# Patient Record
Sex: Male | Born: 1975 | Race: White | Hispanic: No | Marital: Married | Smoking: Light tobacco smoker
Health system: Southern US, Community
[De-identification: ages and names within clinical notes are randomized; demographics above are authoritative.]

---

## 2011-06-22 ENCOUNTER — Other Ambulatory Visit (HOSPITAL_COMMUNITY): Payer: Self-pay | Admitting: Urology

## 2011-06-22 DIAGNOSIS — N529 Male erectile dysfunction, unspecified: Secondary | ICD-10-CM

## 2011-06-30 ENCOUNTER — Other Ambulatory Visit (HOSPITAL_COMMUNITY): Payer: Self-pay | Admitting: Urology

## 2011-06-30 ENCOUNTER — Ambulatory Visit (HOSPITAL_COMMUNITY)
Admission: RE | Admit: 2011-06-30 | Discharge: 2011-06-30 | Disposition: A | Discharge status: Home / Self Care | Payer: BC Managed Care – PPO | Source: Ambulatory Visit | Attending: Urology | Admitting: Urology

## 2011-06-30 DIAGNOSIS — N508 Other specified disorders of male genital organs: Secondary | ICD-10-CM | POA: Insufficient documentation

## 2011-06-30 DIAGNOSIS — N529 Male erectile dysfunction, unspecified: Secondary | ICD-10-CM

## 2014-03-30 ENCOUNTER — Ambulatory Visit (INDEPENDENT_AMBULATORY_CARE_PROVIDER_SITE_OTHER): Payer: 59 | Admitting: Family Medicine

## 2014-03-30 ENCOUNTER — Encounter: Payer: Self-pay | Admitting: Family Medicine

## 2014-03-30 ENCOUNTER — Encounter (INDEPENDENT_AMBULATORY_CARE_PROVIDER_SITE_OTHER): Payer: Self-pay

## 2014-03-30 VITALS — BP 109/73 | HR 89 | Temp 97.2°F | Ht 70.0 in | Wt 256.6 lb

## 2014-03-30 DIAGNOSIS — R197 Diarrhea, unspecified: Secondary | ICD-10-CM

## 2014-03-30 MED ORDER — DIPHENOXYLATE-ATROPINE 2.5-0.025 MG PO TABS: 2.0000 | ORAL_TABLET | Freq: Four times a day (QID) | ORAL | Status: DC | PRN

## 2014-03-30 NOTE — Progress Notes (Signed)
   Subjective:    Patient ID: Jason Mcclure, male    DOB: 09/19/1975, 38 y.o.   MRN: [id number]  HPI  This 38 y.o. male presents for evaluation of diarrhea for 5 days.  He developed fever and diarrhea for first 2 days and then he has been having persistent gastroenteritis sx's.  He was seen by telemedicine and rx'd tamiflu.  Review of Systems C/o diarrhea   No chest pain, SOB, HA, dizziness, vision change, N/V, diarrhea, constipation, dysuria, urinary urgency or frequency, myalgias, arthralgias or rash.  Objective:   Physical Exam  Vital signs noted  Well developed well nourished male.  HEENT - Head atraumatic Normocephalic                Eyes - PERRLA, Conjuctiva - clear Sclera- Clear EOMI                Ears - EAC's Wnl TM's Wnl Gross Hearing WNL                Throat - oropharanx wnl Respiratory - Lungs CTA bilateral Cardiac - RRR S1 and S2 without murmur GI - Abdomen soft Nontender and bowel sounds active x 4 Extremities - No edema. Neuro - Grossly intact.      Assessment & Plan:  Diarrhea - Plan: diphenoxylate-atropine (LOMOTIL) 2.5-0.025 MG per tablet  Deatra Canter FNP

## 2016-06-01 ENCOUNTER — Encounter (INDEPENDENT_AMBULATORY_CARE_PROVIDER_SITE_OTHER): Payer: Self-pay

## 2016-06-01 ENCOUNTER — Ambulatory Visit (INDEPENDENT_AMBULATORY_CARE_PROVIDER_SITE_OTHER): Payer: Commercial Managed Care - HMO | Admitting: Family Medicine

## 2016-06-01 ENCOUNTER — Ambulatory Visit (INDEPENDENT_AMBULATORY_CARE_PROVIDER_SITE_OTHER): Payer: Commercial Managed Care - HMO

## 2016-06-01 ENCOUNTER — Encounter: Payer: Self-pay | Admitting: Family Medicine

## 2016-06-01 VITALS — BP 117/77 | HR 84 | Temp 97.0°F | Ht 70.0 in | Wt 254.0 lb

## 2016-06-01 DIAGNOSIS — G8929 Other chronic pain: Secondary | ICD-10-CM

## 2016-06-01 DIAGNOSIS — N529 Male erectile dysfunction, unspecified: Secondary | ICD-10-CM

## 2016-06-01 DIAGNOSIS — R252 Cramp and spasm: Secondary | ICD-10-CM | POA: Diagnosis not present

## 2016-06-01 DIAGNOSIS — M542 Cervicalgia: Secondary | ICD-10-CM

## 2016-06-01 DIAGNOSIS — Z23 Encounter for immunization: Secondary | ICD-10-CM

## 2016-06-01 MED ORDER — SILDENAFIL CITRATE 20 MG PO TABS: 20.0000 mg | ORAL_TABLET | Freq: Every day | ORAL | 5 refills | Status: DC | PRN

## 2016-06-01 MED ORDER — PREDNISONE 10 MG PO TABS: ORAL_TABLET | ORAL | 0 refills | Status: DC

## 2016-06-01 MED ORDER — CYCLOBENZAPRINE HCL 10 MG PO TABS: 10.0000 mg | ORAL_TABLET | Freq: Three times a day (TID) | ORAL | 1 refills | Status: DC | PRN

## 2016-06-01 NOTE — Progress Notes (Signed)
Subjective:  Patient ID: Jason Mcclure, male    DOB: 06/26/1976  Age: 40 y.o. MRN: [id number]  CC: Shoulder Pain (left side shoulder, neck pain radiates to front side) and Weakness (bilateral forearm, wrist cramp / weakness)   HPI Raynaldo Falco presents for pain and tenderness at superior aspect of the Trapezius. Radiates from the posterior base of the neck on the left and into the ball of the shoulder. He also says that he feels some weakness in both arms particularly and forearm. He's had some wrist cramping primarily on the left. Severity is moderate. Onset of the neck pain is chronic. Shoulder has been increasing insidiously. It began several weeks ago. Has felt some moderate severity. It is decreasing ability to use the left upper extremity. Some on the right as well.   History Sayeed has no past medical history on file.   He has no past surgical history on file.   His family history is not on file.He reports that he has been smoking.  He has never used smokeless tobacco. He reports that he drinks alcohol. He reports that he does not use drugs.    ROS Review of Systems  Constitutional: Negative for chills, diaphoresis and fever.  HENT: Negative for rhinorrhea and sore throat.   Respiratory: Negative for cough and shortness of breath.   Cardiovascular: Negative for chest pain.  Gastrointestinal: Negative for abdominal pain.  Musculoskeletal: Negative for arthralgias and myalgias.  Skin: Negative for rash.  Neurological: Negative for headaches.    Objective:  BP 117/77   Pulse 84   Temp 97 F (36.1 C) (Oral)   Ht '5\' 10"'  (1.778 m)   Wt 254 lb (115.2 kg)   BMI 36.45 kg/m   BP Readings from Last 3 Encounters:  06/01/16 117/77  03/30/14 109/73    Wt Readings from Last 3 Encounters:  06/01/16 254 lb (115.2 kg)  03/30/14 256 lb 9.6 oz (116.4 kg)     Physical Exam  Constitutional: He is oriented to person, place, and time. He appears well-developed and  well-nourished. No distress.  HENT:  Head: Normocephalic and atraumatic.  Right Ear: External ear normal.  Left Ear: External ear normal.  Nose: Nose normal.  Mouth/Throat: Oropharynx is clear and moist.  Eyes: Conjunctivae and EOM are normal. Pupils are equal, round, and reactive to light.  Neck: Normal range of motion. Neck supple. No thyromegaly present.  Cardiovascular: Normal rate, regular rhythm and normal heart sounds.   No murmur heard. Pulmonary/Chest: Effort normal and breath sounds normal. No respiratory distress. He has no wheezes. He has no rales.  Abdominal: Soft. Bowel sounds are normal. He exhibits no distension. There is no tenderness.  Musculoskeletal: He exhibits tenderness (left posterior neck and superior trapezius). He exhibits no edema or deformity.  Lymphadenopathy:    He has no cervical adenopathy.  Neurological: He is alert and oriented to person, place, and time. He has normal reflexes. He exhibits abnormal muscle tone (mild decrease of tone LUE).  Skin: Skin is warm and dry.  Psychiatric: He has a normal mood and affect. His behavior is normal. Judgment and thought content normal.     Lab Results  Component Value Date   WBC 9.8 06/01/2016   HCT 49.8 06/01/2016   PLT 264 06/01/2016   GLUCOSE 85 06/01/2016   CHOL 209 (H) 06/01/2016   TRIG 268 (H) 06/01/2016   HDL 42 06/01/2016   LDLCALC 113 (H) 06/01/2016   ALT 24 06/01/2016   AST  13 06/01/2016   NA 140 06/01/2016   K 4.6 06/01/2016   CL 99 06/01/2016   CREATININE 1.19 06/01/2016   BUN 15 06/01/2016   CO2 27 06/01/2016   TSH 1.880 06/01/2016    US Scrotum  Result Date: 06/30/2011 *RADIOLOGY REPORT* Clinical Data:  Erectile dysfunction, lump in left testicle SCROTAL ULTRASOUND DOPPLER ULTRASOUND OF THE TESTICLES Technique: Complete ultrasound examination of the testicles, epididymis, and other scrotal structures was performed.  Color and spectral Doppler ultrasound were also utilized to evaluate  blood flow to the testicles. Comparison:  None Findings: Right testis:  4.3 x 2.2 x 3.1 cm.  Normal echogenicity.  No mass or calcification.  Internal blood flow present on color Doppler imaging. Left testis:  4.5 x 2.3 x 3.2 cm.  Normal echogenicity.  No mass or calcification.  Internal blood flow present on color Doppler imaging. Right epididymis:  Normal in size and appearance Left epididymis:  Normal in size and appearance. Hydocele:  Absent bilaterally Varicocele:  Absent on right.  Upper normal size of veins on left without definite varicocele. Pulsed Doppler interrogation of both testes demonstrates low resistance flow bilaterally. No evidence of testicular torsion or hyperemia/inflammatory process. IMPRESSION: No evidence of testicular mass. No definite acute intrascrotal abnormalities. Original Report Authenticated By: Burnetta Sabin, M.D.  [place] Art/ven Flow Abd Pelv Doppler  Result Date: 06/30/2011 *RADIOLOGY REPORT* Clinical Data:  Erectile dysfunction, lump in left testicle SCROTAL ULTRASOUND DOPPLER ULTRASOUND OF THE TESTICLES Technique: Complete ultrasound examination of the testicles, epididymis, and other scrotal structures was performed.  Color and spectral Doppler ultrasound were also utilized to evaluate blood flow to the testicles. Comparison:  None Findings: Right testis:  4.3 x 2.2 x 3.1 cm.  Normal echogenicity.  No mass or calcification.  Internal blood flow present on color Doppler imaging. Left testis:  4.5 x 2.3 x 3.2 cm.  Normal echogenicity.  No mass or calcification.  Internal blood flow present on color Doppler imaging. Right epididymis:  Normal in size and appearance Left epididymis:  Normal in size and appearance. Hydocele:  Absent bilaterally Varicocele:  Absent on right.  Upper normal size of veins on left without definite varicocele. Pulsed Doppler interrogation of both testes demonstrates low resistance flow bilaterally. No evidence of testicular torsion or  hyperemia/inflammatory process. IMPRESSION: No evidence of testicular mass. No definite acute intrascrotal abnormalities. Original Report Authenticated By: Burnetta Sabin, M.D.   Assessment & Plan:   Tomas was seen today for shoulder pain and weakness.  Diagnoses and all orders for this visit:  Erectile dysfunction, unspecified erectile dysfunction type -     TSH -     Testosterone,Free and Total -     CMP14+EGFR -     CBC with Differential/Platelet -     Lipid panel -     PSA Total (Reflex To Free)  Encounter for immunization -     Flu Vaccine QUAD 36+ mos IM  Neck pain, chronic -     DG Cervical Spine Complete; Future -     Ambulatory referral to Physical Therapy  Muscle cramps -     DG Cervical Spine Complete; Future -     Testosterone,Free and Total -     CMP14+EGFR -     CBC with Differential/Platelet -     Lipid panel -     Ambulatory referral to Physical Therapy  Other orders -     predniSONE (DELTASONE) 10 MG tablet; Take 5 daily for 3  days followed by 4,3,2 and 1 for 3 days each. -     cyclobenzaprine (FLEXERIL) 10 MG tablet; Take 1 tablet (10 mg total) by mouth 3 (three) times daily as needed for muscle spasms. -     sildenafil (REVATIO) 20 MG tablet; Take 1 tablet (20 mg total) by mouth daily as needed (2-5 as needed).     I have discontinued Mr. Kai Oseltamivir Phosphate (TAMIFLU PO) and diphenoxylate-atropine. I am also having him start on predniSONE, cyclobenzaprine, and sildenafil.  Meds ordered this encounter  Medications  . predniSONE (DELTASONE) 10 MG tablet    Sig: Take 5 daily for 3 days followed by 4,3,2 and 1 for 3 days each.    Dispense:  45 tablet    Refill:  0  . cyclobenzaprine (FLEXERIL) 10 MG tablet    Sig: Take 1 tablet (10 mg total) by mouth 3 (three) times daily as needed for muscle spasms.    Dispense:  90 tablet    Refill:  1  . sildenafil (REVATIO) 20 MG tablet    Sig: Take 1 tablet (20 mg total) by mouth daily as needed  (2-5 as needed).    Dispense:  50 tablet    Refill:  5     Follow-up: Return in about 2 weeks (around 06/15/2016).  Claretta Fraise, M.D.

## 2016-06-02 LAB — CMP14+EGFR
ALBUMIN: 4.6 g/dL (ref 3.5–5.5)
ALK PHOS: 70 IU/L (ref 39–117)
ALT: 24 IU/L (ref 0–44)
AST: 13 IU/L (ref 0–40)
Albumin/Globulin Ratio: 1.8 (ref 1.2–2.2)
BILIRUBIN TOTAL: 0.3 mg/dL (ref 0.0–1.2)
BUN / CREAT RATIO: 13 (ref 9–20)
BUN: 15 mg/dL (ref 6–24)
CHLORIDE: 99 mmol/L (ref 96–106)
CO2: 27 mmol/L (ref 18–29)
CREATININE: 1.19 mg/dL (ref 0.76–1.27)
Calcium: 9.5 mg/dL (ref 8.7–10.2)
GFR calc Af Amer: 87 mL/min/{1.73_m2} (ref >59)
GFR calc non Af Amer: 75 mL/min/{1.73_m2} (ref >59)
GLUCOSE: 85 mg/dL (ref 65–99)
Globulin, Total: 2.6 g/dL (ref 1.5–4.5)
Potassium: 4.6 mmol/L (ref 3.5–5.2)
SODIUM: 140 mmol/L (ref 134–144)
Total Protein: 7.2 g/dL (ref 6.0–8.5)

## 2016-06-02 LAB — CBC WITH DIFFERENTIAL/PLATELET
BASOS ABS: 0 10*3/uL (ref 0.0–0.2)
Basos: 0 %
EOS (ABSOLUTE): 0.2 10*3/uL (ref 0.0–0.4)
EOS: 3 %
HEMATOCRIT: 49.8 % (ref 37.5–51.0)
Hemoglobin: 17.1 g/dL (ref 12.6–17.7)
IMMATURE GRANULOCYTES: 1 %
Immature Grans (Abs): 0.1 10*3/uL (ref 0.0–0.1)
LYMPHS ABS: 3.2 10*3/uL — AB (ref 0.7–3.1)
Lymphs: 33 %
MCH: 31.7 pg (ref 26.6–33.0)
MCHC: 34.3 g/dL (ref 31.5–35.7)
MCV: 92 fL (ref 79–97)
MONOCYTES: 7 %
MONOS ABS: 0.7 10*3/uL (ref 0.1–0.9)
NEUTROS PCT: 56 %
Neutrophils Absolute: 5.5 10*3/uL (ref 1.4–7.0)
PLATELETS: 264 10*3/uL (ref 150–379)
RBC: 5.4 x10E6/uL (ref 4.14–5.80)
RDW: 13.1 % (ref 12.3–15.4)
WBC: 9.8 10*3/uL (ref 3.4–10.8)

## 2016-06-02 LAB — TESTOSTERONE,FREE AND TOTAL
TESTOSTERONE: 420 ng/dL (ref 264–916)
Testosterone, Free: 13.7 pg/mL (ref 6.8–21.5)

## 2016-06-02 LAB — LIPID PANEL
CHOL/HDL RATIO: 5 ratio (ref 0.0–5.0)
Cholesterol, Total: 209 mg/dL — ABNORMAL HIGH (ref 100–199)
HDL: 42 mg/dL (ref >39)
LDL Calculated: 113 mg/dL — ABNORMAL HIGH (ref 0–99)
Triglycerides: 268 mg/dL — ABNORMAL HIGH (ref 0–149)
VLDL Cholesterol Cal: 54 mg/dL — ABNORMAL HIGH (ref 5–40)

## 2016-06-02 LAB — PSA TOTAL (REFLEX TO FREE): PROSTATE SPECIFIC AG, SERUM: 0.5 ng/mL (ref 0.0–4.0)

## 2016-06-02 LAB — TSH: TSH: 1.88 u[IU]/mL (ref 0.450–4.500)

## 2016-06-08 ENCOUNTER — Ambulatory Visit: Payer: Commercial Managed Care - HMO | Attending: Family Medicine | Admitting: Physical Therapy

## 2016-06-08 DIAGNOSIS — R293 Abnormal posture: Secondary | ICD-10-CM | POA: Diagnosis present

## 2016-06-08 DIAGNOSIS — M542 Cervicalgia: Secondary | ICD-10-CM | POA: Diagnosis not present

## 2016-06-08 DIAGNOSIS — M546 Pain in thoracic spine: Secondary | ICD-10-CM | POA: Diagnosis present

## 2016-06-08 NOTE — Therapy (Signed)
[facility] [address] [place], [place], [address] Phone: [phone]   Fax:  [phone]  Physical Therapy Evaluation  Patient Details  Name: Jason Mcclure MRN: [id number] Date of Birth: 06-29-1976 Referring Provider: Mechele ClaudeWarren Stacks MD.  Encounter Date: 06/08/2016      PT End of Session - 06/08/16 1226    Visit Number 1   Number of Visits 12   Date for PT Re-Evaluation 07/20/16   PT Start Time 1115   PT Stop Time 1155   PT Time Calculation (min) 40 min   Activity Tolerance Patient tolerated treatment well   Behavior [facility] for tasks assessed/performed      No past medical history on file.  No past surgical history on file.  There were no vitals filed for this visit.       Subjective Assessment - 06/08/16 1213    Subjective The patient states that over the last3-4 months he has been experiencing neck and shoulder blade pain left greater than right.  His pain-level today is a 2-3/10 but can rise to higher levels (5-6+/10) with increased computer use and heavy lifting.  His X-ray demonstrates:  Mild uncovertebral ridging and early osseous foraminal narrowing at  C3-4.                Limitations Sitting   How long can you sit comfortably? 20 minutes.   Currently in Pain? Yes   Pain Score 3    Pain Location Neck   Pain Orientation Left   Pain Descriptors / Indicators Tightness   Pain Type Acute pain   Pain Onset More than a month ago   Pain Frequency Constant   Aggravating Factors  Please see above.   Pain Relieving Factors Please see above.            [facility] - 06/08/16 0001      Assessment   Medical Diagnosis Neck pain; muscle cramps.   Referring Provider Mechele ClaudeWarren Stacks MD.   Onset Date/Surgical Date --  3-4 months.     Precautions   Precautions None     Restrictions   Weight Bearing Restrictions No     Balance Screen   Has the patient fallen in the past 6 months No   Has the  patient had a decrease in activity level because of a fear of falling?  No   Is the patient reluctant to leave their home because of a fear of falling?  No     Home Environment   Living Environment Private residence     Prior Function   Level of Independence Independent     Posture/Postural Control   Posture/Postural Control Postural limitations   Postural Limitations Rounded Shoulders;Forward head     ROM / Strength   AROM / PROM / Strength AROM;Strength     AROM   Overall AROM Comments Active left cervical rotation is limited to 50 degrees and right= 65 degrees.     Strength   Overall Strength Comments Normal UE strength.     Palpation   Palpation comment C/o left mid-cervical pain and tender over left levator scapulae and generally in the area of the left medial scapular region.     Special Tests    Special Tests --  Diminished bilateral UE DTR's.     Ambulation/Gait   Gait Comments WNL.                   [facility]  Treatment/Exercise - 06/08/16 0001      Modalities   Modalities Ultrasound     Ultrasound   Ultrasound Location In prone:  Combo e'stim/U/S at 1.50 W/CM2 x 8 minutes at 1.50 W/CM2 to left levator scapulae region.     Manual Therapy   Manual Therapy Soft tissue mobilization   Manual therapy comments In prone:  Left levator scapulae release and PA mobs to mid to upper thoracic region and costo-vertebral mobs. x 5 minutes.                     PT Long Term Goals - 06/08/16 1230      PT LONG TERM GOAL #1   Title Independent with a HEP.   Time 6   Period Weeks   Status New     PT LONG TERM GOAL #2   Title Increase active cervical rotation to 70 degrees+ so patient can turn head more easily while driving.   Time 6   Period Weeks   Status New     PT LONG TERM GOAL #3   Title Perform ADL's with pain not > 2-3/10.   Time 6   Period Weeks   Status New               Plan - 06/08/16 1227    Clinical Impression  Statement The patient has limited active cervical rotation especially to left.  He states his shoulder muscle feel tight especially on left and over the left levator scapulae.   Rehab Potential Excellent   PT Frequency 2x / week   PT Duration 6 weeks   PT Treatment/Interventions ADLs/Self Care Home Management;Cryotherapy;Electrical Stimulation;Ultrasound;Traction;Moist Heat;Therapeutic activities;Therapeutic exercise;Patient/family education;Passive range of motion;Manual techniques;Dry needling   PT Next Visit Plan Intermittent cervical traction at 16#; please review chin tucks and extension (to be done several times a day); In prone:  Combo e'stim/U/S to left upper scapular region; STW/M.      Patient will benefit from skilled therapeutic intervention in order to improve the following deficits and impairments:  Pain, Decreased activity tolerance, Decreased range of motion  Visit Diagnosis: Cervicalgia  Pain in thoracic spine  Abnormal posture     Problem List There are no active problems to display for this patient.   Eliodoro Gullett, [place] MPT 06/08/2016, 12:34 PM  [facility] [address] [place], [place], [address] Phone: [phone]   Fax:  [phone]  Name: Jason Mcclure MRN: [id number] Date of Birth: 18-May-1976

## 2016-06-11 ENCOUNTER — Ambulatory Visit: Payer: Commercial Managed Care - HMO | Admitting: Physical Therapy

## 2016-06-11 ENCOUNTER — Encounter: Payer: Self-pay | Admitting: Physical Therapy

## 2016-06-11 DIAGNOSIS — M542 Cervicalgia: Secondary | ICD-10-CM | POA: Diagnosis not present

## 2016-06-11 DIAGNOSIS — R293 Abnormal posture: Secondary | ICD-10-CM

## 2016-06-11 DIAGNOSIS — M546 Pain in thoracic spine: Secondary | ICD-10-CM

## 2016-06-11 NOTE — Therapy (Addendum)
[facility] [place], [place], [address] Phone: [phone]   Fax:  [phone]  Physical Therapy Treatment  Patient Details  Name: Jason Mcclure MRN: [id number] Date of Birth: 13-Jan-1976 Referring Provider: Claretta Fraise MD.  Encounter Date: 06/11/2016      PT End of Session - 06/11/16 0813    Visit Number 2   Number of Visits 12   Date for PT Re-Evaluation 07/20/16   PT Start Time 0735   PT Stop Time 0825   PT Time Calculation (min) 50 min   Activity Tolerance Patient tolerated treatment well   Behavior During Therapy [facility] for tasks assessed/performed      History reviewed. No pertinent past medical history.  History reviewed. No pertinent surgical history.  There were no vitals filed for this visit.      Subjective Assessment - 06/11/16 0738    Subjective Patiet reported some relief after last treatment   Limitations Sitting   How long can you sit comfortably? 20 minutes.   Currently in Pain? Yes   Pain Score 2    Pain Location Neck   Pain Orientation Left   Pain Onset More than a month ago   Pain Frequency Constant   Aggravating Factors  certain movements   Pain Relieving Factors therapy                         [facility] Adult PT Treatment/Exercise - 06/11/16 0001      Modalities   Modalities Traction;Ultrasound     Ultrasound   Ultrasound Location left c-spin/med scap   Ultrasound Parameters 1.5w/cm2/50%/69mz x169m   Ultrasound Goals Pain     Traction   Type of Traction Cervical   Min (lbs) 5   Max (lbs) 16   Hold Time 99   Rest Time 5   Time 15     Manual Therapy   Manual Therapy Myofascial release;Soft tissue mobilization   Soft tissue mobilization manual STW to left medial scap area of pain with myofascial release to musles to release spasms                     PT Long Term Goals - 06/11/16 0814      PT LONG TERM GOAL #1   Title Independent with a  HEP.   Time 6   Period Weeks   Status On-going     PT LONG TERM GOAL #2   Title Increase active cervical rotation to 70 degrees+ so patient can turn head more easily while driving.   Time 6   Period Weeks   Status On-going     PT LONG TERM GOAL #3   Title Perform ADL's with pain not > 2-3/10.   Time 6   Period Weeks   Status On-going               Plan - 06/11/16 0815    Clinical Impression Statement Patient tolerated treatment well today and continues to respond well to treatments thus far. Patient has reported less pain and less frequent episodes of sharp pains. Patient reports doing chin tucks several times a day. Patient current goals ongoing due to pain and ROM deficits.    Rehab Potential Excellent   PT Frequency 2x / week   PT Duration 6 weeks   PT Treatment/Interventions ADLs/Self Care Home Management;Cryotherapy;Electrical Stimulation;Ultrasound;Traction;Moist Heat;Therapeutic activities;Therapeutic exercise;Patient/family education;Passive range of motion;Manual techniques;Dry needling   PT Next Visit Plan  cont with POC per MPT   Consulted and Agree with Plan of Care Patient      Patient will benefit from skilled therapeutic intervention in order to improve the following deficits and impairments:  Pain, Decreased activity tolerance, Decreased range of motion  Visit Diagnosis: Cervicalgia  Pain in thoracic spine  Abnormal posture     Problem List There are no active problems to display for this patient.   Phillips Climes, PTA 06/11/2016, 8:25 AM  [facility] [place], [place], [address] Phone: [phone]   Fax:  [phone]  Name: Jason Mcclure MRN: [id number] Date of Birth: [birth year]  PHYSICAL THERAPY DISCHARGE SUMMARY  Visits from Start of Care: 2.  Current functional level related to goals / functional outcomes: See above.   Remaining deficits: See above.   Education  / Equipment:  Plan: Patient agrees to discharge.  Patient goals were not met. Patient is being discharged due to not returning since the last visit.  ?????         Mali Applegate MPT

## 2016-06-15 ENCOUNTER — Ambulatory Visit: Payer: Commercial Managed Care - HMO | Admitting: Physical Therapy

## 2016-06-21 ENCOUNTER — Encounter: Payer: Self-pay | Admitting: Family Medicine

## 2017-03-17 ENCOUNTER — Ambulatory Visit (INDEPENDENT_AMBULATORY_CARE_PROVIDER_SITE_OTHER): Payer: 59

## 2017-03-17 ENCOUNTER — Encounter: Payer: Self-pay | Admitting: Family Medicine

## 2017-03-17 ENCOUNTER — Ambulatory Visit (INDEPENDENT_AMBULATORY_CARE_PROVIDER_SITE_OTHER): Payer: 59 | Admitting: Family Medicine

## 2017-03-17 VITALS — BP 104/64 | HR 86 | Temp 97.4°F | Ht 70.0 in | Wt 242.0 lb

## 2017-03-17 DIAGNOSIS — R0781 Pleurodynia: Secondary | ICD-10-CM

## 2017-03-17 DIAGNOSIS — J40 Bronchitis, not specified as acute or chronic: Secondary | ICD-10-CM | POA: Diagnosis not present

## 2017-03-17 DIAGNOSIS — J329 Chronic sinusitis, unspecified: Secondary | ICD-10-CM | POA: Diagnosis not present

## 2017-03-17 MED ORDER — AMOXICILLIN-POT CLAVULANATE 875-125 MG PO TABS: 1.0000 | ORAL_TABLET | Freq: Two times a day (BID) | ORAL | 0 refills | Status: DC

## 2017-03-17 NOTE — Progress Notes (Signed)
Chief Complaint  Patient presents with  . Cough    pt here today c/o cough and congestion for a while now and it hurts his chest to cough.    HPI  Patient presents today for Symptoms include congestion, facial pain, nasal congestion, occasional productive cough, post nasal drip and sinus pressure. There is no fever, chills, or sweats. Onset of symptoms was  Two weeks ago, gradually worsening since that time. Moving into the chest at right upper lung fieldWorse with taking a deep breath. Just a little bit dyspneic. Feels like he has to take a deep breath or yawn every now and then   PMH: Smoking status noted ROS: Per HPI  Objective: BP 104/64   Pulse 86   Temp (!) 97.4 F (36.3 C) (Oral)   Ht 5\' 10"  (1.778 m)   Wt 242 lb (109.8 kg)   BMI 34.72 kg/m  Gen: NAD, alert, cooperative with exam HEENT: NCAT, EOMI, PERRL CV: RRR, good S1/S2, no murmur Resp: scant rhonchi right upper lung field non-labored otherwise CTA Abd: SNTND, BS present, no guarding or organomegaly Ext: No edema, warm Neuro: Alert and oriented, No gross deficits  Assessment and plan:  1. Pleurodynia   2. Sinobronchitis     Meds ordered this encounter  Medications  . amoxicillin-clavulanate (AUGMENTIN) 875-125 MG tablet    Sig: Take 1 tablet by mouth 2 (two) times daily.    Dispense:  20 tablet    Refill:  0    Orders Placed This Encounter  Procedures  . DG Chest 2 View    Standing Status:   Future    Number of Occurrences:   1    Standing Expiration Date:   05/17/2018    Order Specific Question:   Reason for Exam (SYMPTOM  OR DIAGNOSIS REQUIRED)    Answer:   cough    Order Specific Question:   Preferred imaging location?    Answer:   Internal    Follow up as needed.  Mechele Claude, MD

## 2017-03-26 ENCOUNTER — Other Ambulatory Visit: Payer: Self-pay | Admitting: Family Medicine

## 2017-03-26 ENCOUNTER — Telehealth: Payer: Self-pay | Admitting: Family Medicine

## 2017-03-26 MED ORDER — LEVOFLOXACIN 500 MG PO TABS: 500.0000 mg | ORAL_TABLET | Freq: Every day | ORAL | 0 refills | Status: DC

## 2017-03-26 MED ORDER — DOXYCYCLINE HYCLATE 100 MG PO TABS: 100.0000 mg | ORAL_TABLET | Freq: Two times a day (BID) | ORAL | 0 refills | Status: DC

## 2017-03-26 NOTE — Telephone Encounter (Signed)
Doxycycline Prescription sent to pharmacy   

## 2017-03-26 NOTE — Telephone Encounter (Signed)
Patient aware and verbalized understanding. °

## 2017-07-30 ENCOUNTER — Other Ambulatory Visit: Payer: Self-pay | Admitting: Family Medicine

## 2017-11-19 IMAGING — DX DG CERVICAL SPINE COMPLETE 4+V
6 series · 6 of 6 positions shown · non-contrast
Comparison: None.

CLINICAL DATA: Chronic neck pain and left shoulder pain.

EXAM:
CERVICAL SPINE - COMPLETE 4+ VIEW

[c-spine lat (1 of 2)]
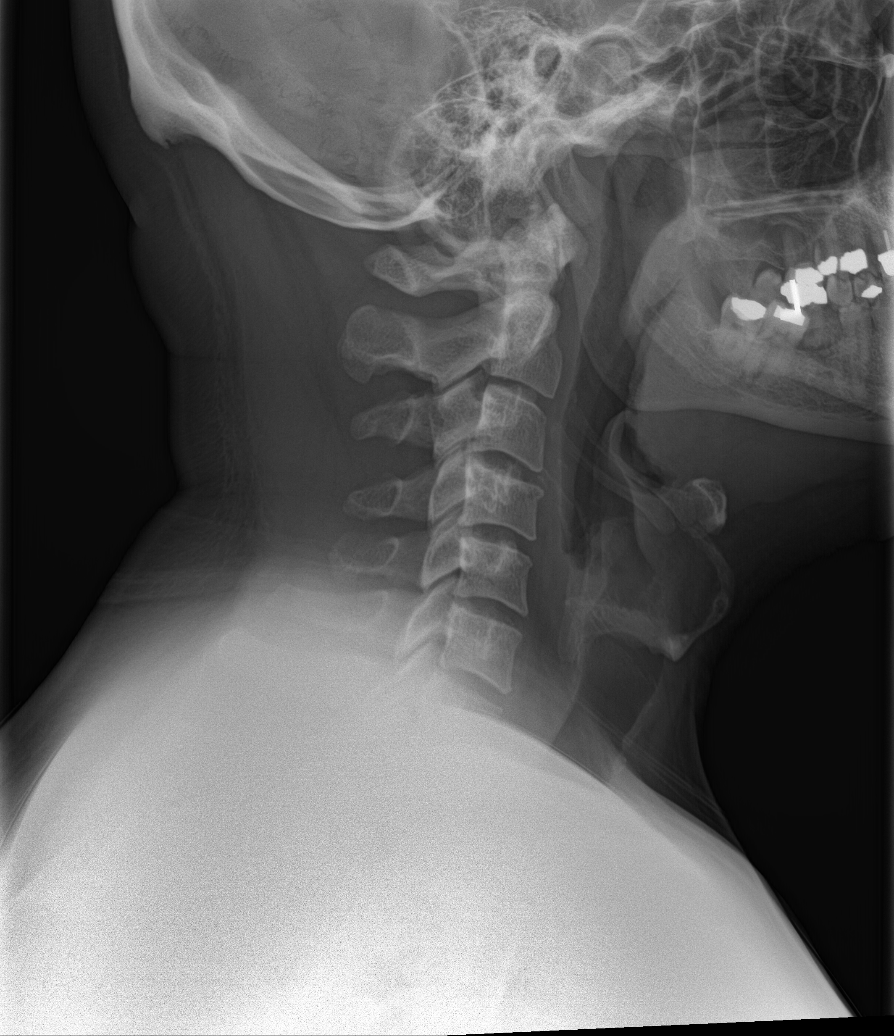

[c-spine obl (1 of 2)]
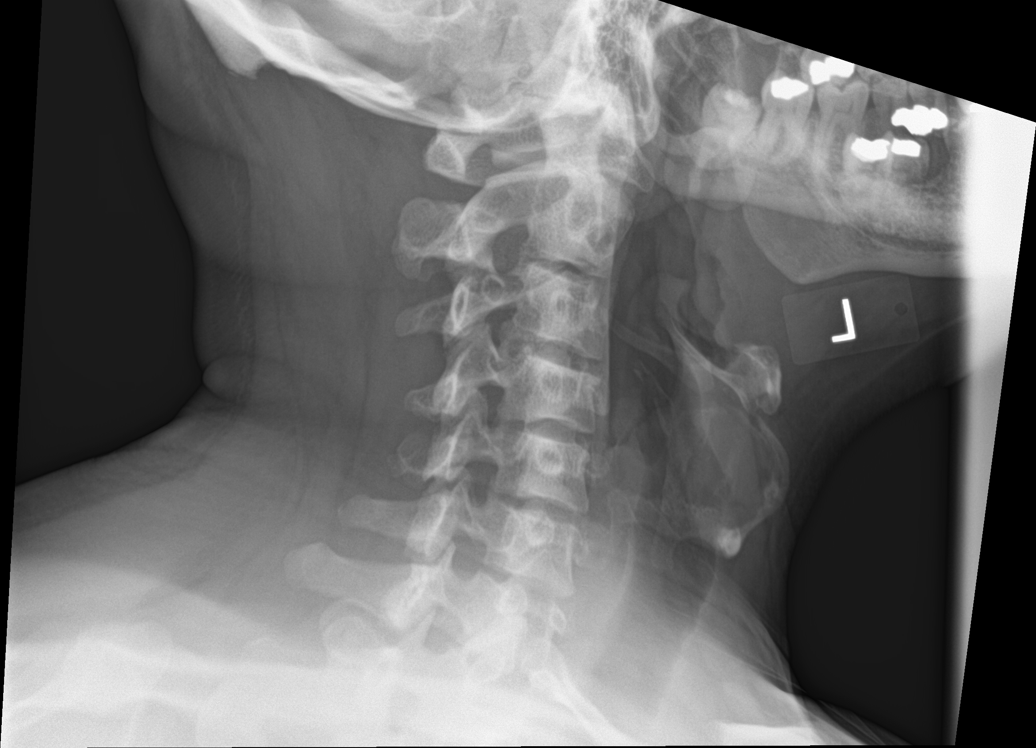

[c-spine obl (2 of 2)]
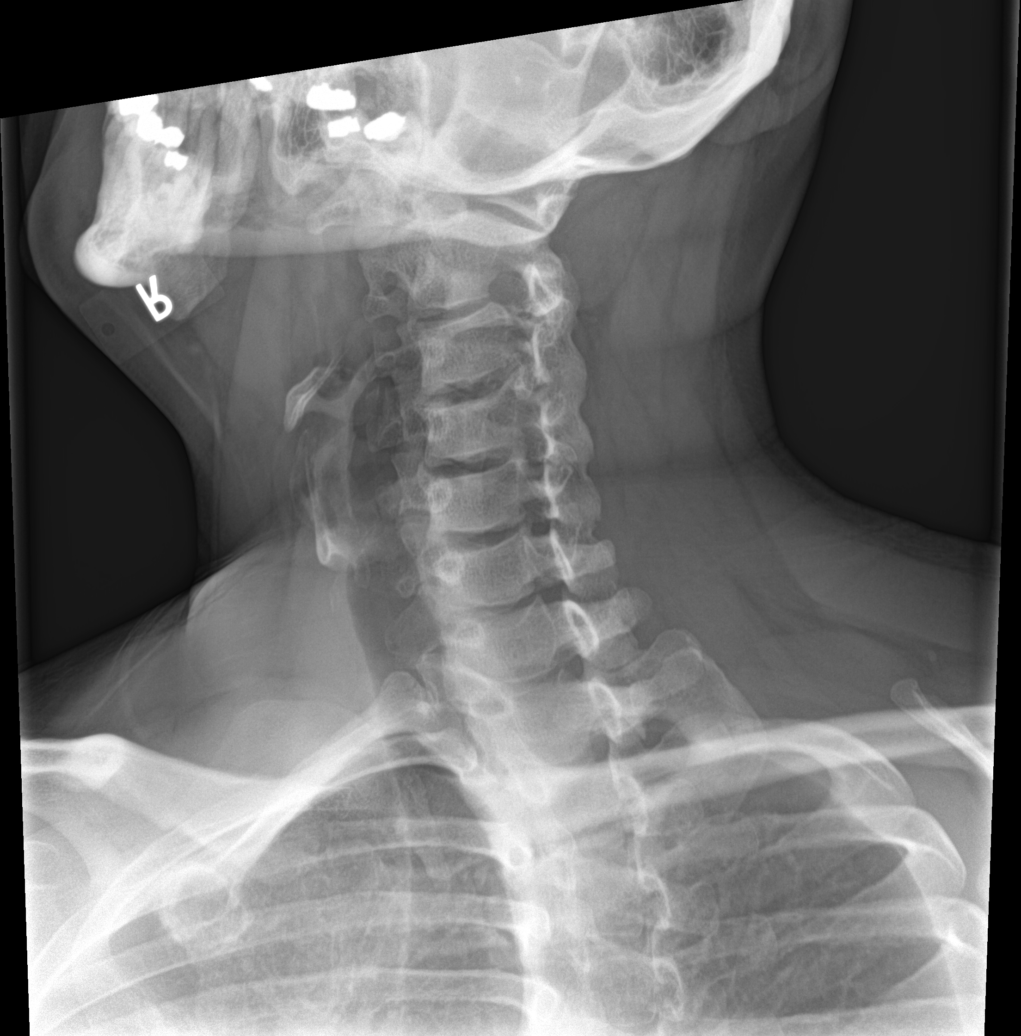

[c-spine ap]
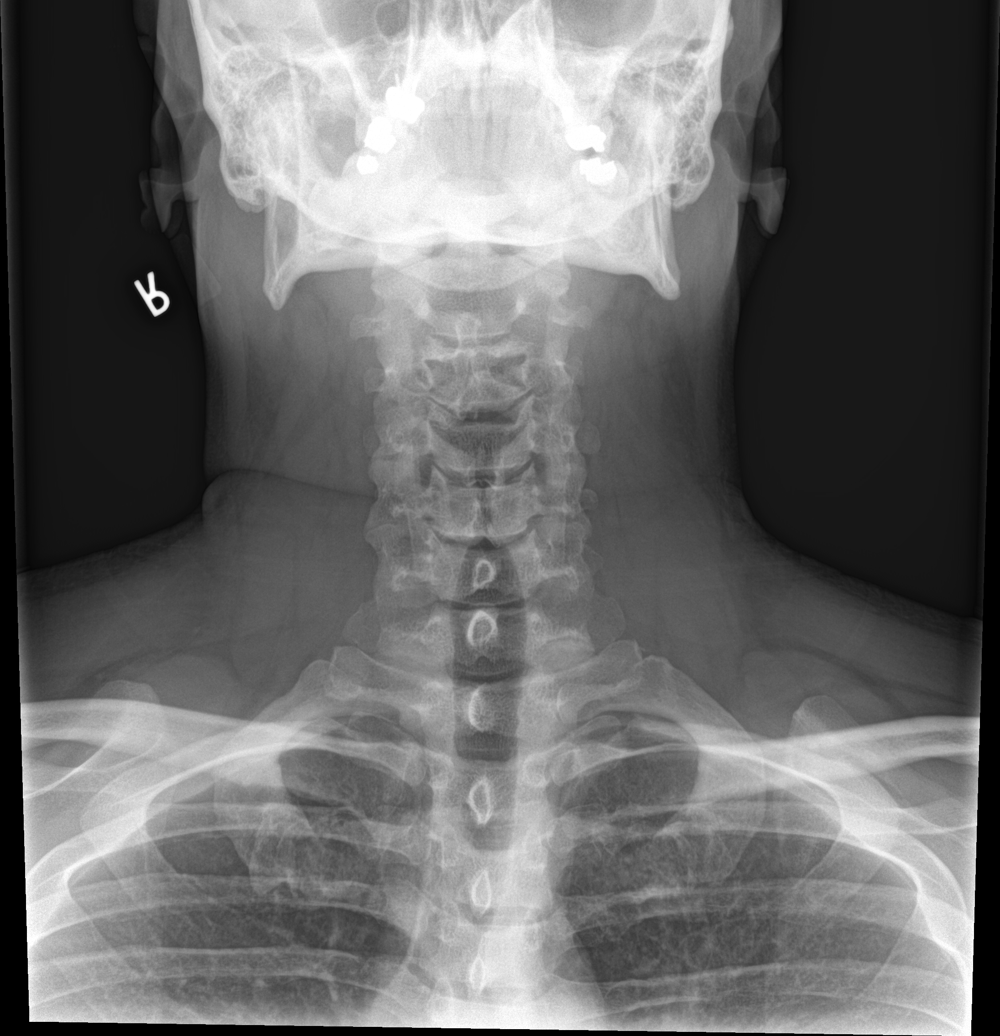

[c-spine open mouth]
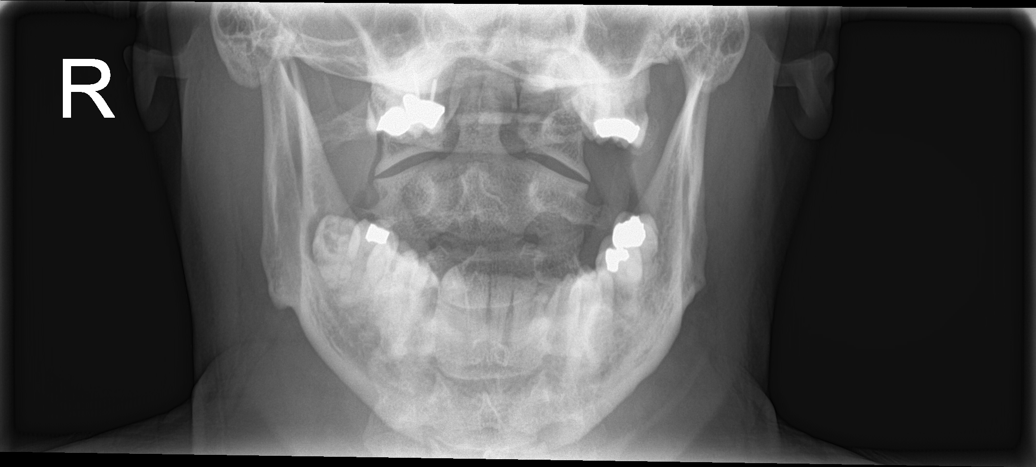

[c-spine lat (2 of 2)]
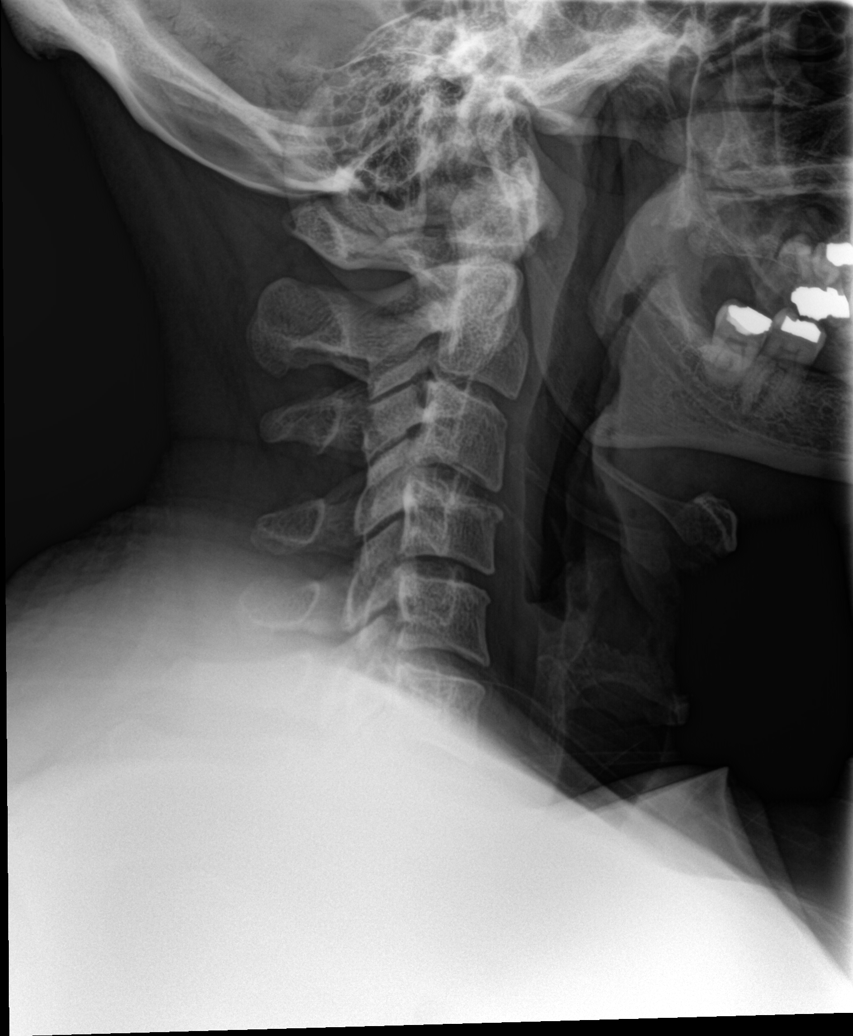

[6 of 6 positions shown; findings below may reference images not displayed]

FINDINGS: Superior body height loss at C4 has a chronic appearance. Mild
uncovertebral ridging with early foraminal narrowing bilaterally at
C3-4 and on the right at C4-5. No acute fracture, endplate erosion,
or evidence of focal bone lesion.
IMPRESSION: Mild uncovertebral ridging and early osseous foraminal narrowing at
C3-4.

## 2018-10-17 ENCOUNTER — Encounter: Payer: 59 | Admitting: Family Medicine

## 2019-02-03 ENCOUNTER — Other Ambulatory Visit: Payer: Self-pay | Admitting: Family Medicine

## 2019-03-08 ENCOUNTER — Other Ambulatory Visit: Payer: Self-pay

## 2019-03-09 ENCOUNTER — Encounter: Payer: Self-pay | Admitting: Family Medicine

## 2019-03-09 ENCOUNTER — Ambulatory Visit: Payer: 59 | Admitting: Family Medicine

## 2019-03-09 VITALS — BP 110/71 | HR 74 | Temp 98.6°F | Ht 70.0 in | Wt 250.0 lb

## 2019-03-09 DIAGNOSIS — M792 Neuralgia and neuritis, unspecified: Secondary | ICD-10-CM

## 2019-03-09 DIAGNOSIS — M79602 Pain in left arm: Secondary | ICD-10-CM | POA: Diagnosis not present

## 2019-03-09 MED ORDER — SILDENAFIL CITRATE 20 MG PO TABS: ORAL_TABLET | ORAL | 5 refills | Status: DC

## 2019-03-09 NOTE — Patient Instructions (Signed)
Follow up with provider in two to three weeks.

## 2019-03-13 ENCOUNTER — Telehealth: Payer: Self-pay | Admitting: Family Medicine

## 2019-03-13 ENCOUNTER — Encounter: Payer: Self-pay | Admitting: Family Medicine

## 2019-03-13 ENCOUNTER — Other Ambulatory Visit: Payer: Self-pay | Admitting: Family Medicine

## 2019-03-13 MED ORDER — PREDNISONE 10 MG PO TABS
ORAL_TABLET | ORAL | 0 refills | Status: DC
Start: 2019-03-13 — End: 2019-03-13

## 2019-03-13 MED ORDER — PREDNISONE 10 MG PO TABS: ORAL_TABLET | ORAL | 0 refills | Status: DC

## 2019-03-13 NOTE — Progress Notes (Signed)
Subjective:  Patient ID: Jason Mcclure, male    DOB: 10-16-1975  Age: 43 y.o. MRN: [id number]  CC: left forearm pain   HPI Jason Mcclure presents for approximately 8 months of increasing pain at the left forearm and hand. His hands feel tight as well, L>R. Pain increases with exertion, grip. The syndrome started with numbness, but that is gone now. He describes the pain as, like a funny bone pain, but runs aalong the cephalad aspect of the forearm.  [facility] 03/09/2019 06/01/2016  Decreased Interest 0 0  Down, Depressed, Hopeless 0 0  PHQ - 2 Score 0 0    History Jason Mcclure has no past medical history on file.   He has no past surgical history on file.   His family history is not on file.He reports that he has been smoking. He has never used smokeless tobacco. He reports current alcohol use. He reports that he does not use drugs.    ROS Review of Systems  Constitutional: Negative for fever.  Respiratory: Negative for shortness of breath.   Cardiovascular: Negative for chest pain.  Musculoskeletal: Negative for arthralgias.  Skin: Negative for rash.  Neurological: Positive for weakness and numbness.    Objective:  BP 110/71    Pulse 74    Temp 98.6 F (37 C) (Temporal)    Ht 5\' 10"  (1.778 m)    Wt 250 lb (113.4 kg)    BMI 35.87 kg/m   BP Readings from Last 3 Encounters:  03/09/19 110/71  03/17/17 104/64  06/01/16 117/77    Wt Readings from Last 3 Encounters:  03/09/19 250 lb (113.4 kg)  03/17/17 242 lb (109.8 kg)  06/01/16 254 lb (115.2 kg)     Physical Exam Vitals signs reviewed.  Constitutional:      Appearance: He is well-developed.  HENT:     Head: Normocephalic and atraumatic.     Right Ear: External ear normal.     Left Ear: External ear normal.     Mouth/Throat:     Pharynx: No oropharyngeal exudate or posterior oropharyngeal erythema.  Eyes:     Pupils: Pupils are equal, round, and reactive to light.  Neck:     Musculoskeletal:  Normal range of motion and neck supple.  Cardiovascular:     Rate and Rhythm: Normal rate and regular rhythm.     Heart sounds: No murmur.  Pulmonary:     Effort: No respiratory distress.     Breath sounds: Normal breath sounds.  Neurological:     Mental Status: He is alert and oriented to person, place, and time.     Sensory: No sensory deficit.     Motor: No weakness.     Coordination: Coordination normal.     Comments: Negative phalen and Tinel's       Assessment & Plan:   Jason Mcclure was seen today for left forearm pain.  Diagnoses and all orders for this visit:  Neuralgia -     Ambulatory referral to Physical Therapy  Left arm pain -     Ambulatory referral to Physical Therapy  Other orders -     sildenafil (REVATIO) 20 MG tablet; TAKE 2-5 TABLETS BY MOUTH ONCE DAILY AS NEEDED -     predniSONE (DELTASONE) 10 MG tablet; Take 5 daily for 2 days followed by 4,3,2 and 1 for 2 days each.    Consider PNCV. Pt. Requests PT, meds first.   I have discontinued Jason Mcclure's levofloxacin, doxycycline, and  predniSONE. I am also having him start on predniSONE. Additionally, I am having him maintain his sildenafil.  Allergies as of 03/09/2019   No Known Allergies     Medication List       Accurate as of March 09, 2019 11:59 PM. If you have any questions, ask your nurse or doctor.        STOP taking these medications   doxycycline 100 MG tablet Commonly known as: VIBRA-TABS Stopped by: Claretta Fraise, MD   levofloxacin 500 MG tablet Commonly known as: LEVAQUIN Stopped by: Claretta Fraise, MD     TAKE these medications   predniSONE 10 MG tablet Commonly known as: DELTASONE Take 5 daily for 2 days followed by 4,3,2 and 1 for 2 days each. Started by: Claretta Fraise, MD   sildenafil 20 MG tablet Commonly known as: REVATIO TAKE 2-5 TABLETS BY MOUTH ONCE DAILY AS NEEDED        Follow-up: Return in about 2 weeks (around 03/23/2019).  Claretta Fraise, M.D.

## 2019-03-13 NOTE — Telephone Encounter (Signed)
I sent in the requested prescription 

## 2019-03-13 NOTE — Telephone Encounter (Signed)
Please review and advise. Do not see anything in the note

## 2019-03-14 MED ORDER — PREDNISONE 10 MG PO TABS: ORAL_TABLET | ORAL | 0 refills | Status: DC

## 2019-03-14 NOTE — Telephone Encounter (Signed)
Patient aware.

## 2019-03-14 NOTE — Addendum Note (Signed)
Addended by: Wardell Heath on: 03/14/2019 08:10 AM   Modules accepted: Orders

## 2019-03-20 ENCOUNTER — Encounter: Payer: 59 | Admitting: Family Medicine

## 2019-03-27 ENCOUNTER — Ambulatory Visit: Payer: Commercial Managed Care - HMO | Admitting: Physical Therapy

## 2019-04-05 ENCOUNTER — Telehealth: Payer: Self-pay | Admitting: Family Medicine

## 2019-04-05 ENCOUNTER — Other Ambulatory Visit: Payer: Self-pay

## 2019-04-06 ENCOUNTER — Encounter: Payer: Self-pay | Admitting: Family Medicine

## 2019-04-06 ENCOUNTER — Ambulatory Visit: Payer: 59 | Admitting: Family Medicine

## 2019-04-06 ENCOUNTER — Ambulatory Visit (INDEPENDENT_AMBULATORY_CARE_PROVIDER_SITE_OTHER): Payer: 59

## 2019-04-06 ENCOUNTER — Other Ambulatory Visit: Payer: Self-pay

## 2019-04-06 VITALS — BP 108/67 | HR 69 | Temp 96.9°F | Ht 70.0 in | Wt 252.0 lb

## 2019-04-06 DIAGNOSIS — M79602 Pain in left arm: Secondary | ICD-10-CM | POA: Diagnosis not present

## 2019-04-06 MED ORDER — PREDNISONE 10 MG PO TABS: ORAL_TABLET | ORAL | 0 refills | Status: DC

## 2019-04-06 MED ORDER — PREDNISONE 10 MG PO TABS: ORAL_TABLET | ORAL | 0 refills | Status: AC

## 2019-04-06 MED ORDER — SILDENAFIL CITRATE 20 MG PO TABS: ORAL_TABLET | ORAL | 5 refills | Status: AC

## 2019-04-06 NOTE — Progress Notes (Signed)
Chief Complaint  Patient presents with  . left arm pain    2 week rck    HPI  Patient presents today for partial improvement in arm with prednisone. Has burning, numbness moderate to severe. Somewhat worse with use.  PMH: Smoking status noted ROS: Per HPI  Objective: BP 108/67   Pulse 69   Temp (!) 96.9 F (36.1 C)   Ht 5\' 10"  (1.778 m)   Wt 252 lb (114.3 kg)   SpO2 96%   BMI 36.16 kg/m  Gen: NAD, alert, cooperative with exam HEENT: NCAT, EOMI, PERRL CV: RRR, good S1/S2, no murmur Resp: CTABL, no wheezes, non-labored Abd: SNTND, BS present, no guarding or organomegaly Ext: No edema, warm. Teder over upper arm. No palpable spasm. Neuro: Alert and oriented, No gross deficits  Assessment and plan:  1. Left arm pain     Meds ordered this encounter  Medications  . DISCONTD: predniSONE (DELTASONE) 10 MG tablet    Sig: Take 5 daily for 2 days followed by 4,3,2 and 1 for 2 days each.    Dispense:  30 tablet    Refill:  0  . predniSONE (DELTASONE) 10 MG tablet    Sig: Take 5 daily for 2 days followed by 4,3,2 and 1 for 2 days each.    Dispense:  30 tablet    Refill:  0  . sildenafil (REVATIO) 20 MG tablet    Sig: TAKE 2-5 TABLETS BY MOUTH ONCE DAILY AS NEEDED    Dispense:  50 tablet    Refill:  5    Generic For:*REVATIO 20MG     Orders Placed This Encounter  Procedures  . DG Forearm Left    Standing Status:   Future    Number of Occurrences:   1    Standing Expiration Date:   06/05/2020    Order Specific Question:   Reason for Exam (SYMPTOM  OR DIAGNOSIS REQUIRED)    Answer:   left arm pain    Order Specific Question:   Preferred imaging location?    Answer:   Internal  . DG Wrist Complete Left    Standing Status:   Future    Number of Occurrences:   1    Standing Expiration Date:   06/05/2020    Order Specific Question:   Reason for Exam (SYMPTOM  OR DIAGNOSIS REQUIRED)    Answer:   left arm pain    Order Specific Question:   Preferred imaging location?   Answer:   Internal  . Nerve conduction test    Standing Status:   Future    Standing Expiration Date:   06/05/2019    Scheduling Instructions:     LUE    Order Specific Question:   Where should this test be performed?    Answer:   [facility]    Follow up as needed.  Claretta Fraise, MD

## 2019-05-01 ENCOUNTER — Ambulatory Visit: Payer: 59 | Admitting: Family Medicine

## 2023-09-27 DIAGNOSIS — M9905 Segmental and somatic dysfunction of pelvic region: Secondary | ICD-10-CM | POA: Diagnosis not present

## 2023-09-27 DIAGNOSIS — M9904 Segmental and somatic dysfunction of sacral region: Secondary | ICD-10-CM | POA: Diagnosis not present

## 2023-09-27 DIAGNOSIS — M62830 Muscle spasm of back: Secondary | ICD-10-CM | POA: Diagnosis not present

## 2023-09-27 DIAGNOSIS — M9903 Segmental and somatic dysfunction of lumbar region: Secondary | ICD-10-CM | POA: Diagnosis not present

## 2023-09-28 DIAGNOSIS — M9903 Segmental and somatic dysfunction of lumbar region: Secondary | ICD-10-CM | POA: Diagnosis not present

## 2023-09-28 DIAGNOSIS — M9904 Segmental and somatic dysfunction of sacral region: Secondary | ICD-10-CM | POA: Diagnosis not present

## 2023-09-28 DIAGNOSIS — M9905 Segmental and somatic dysfunction of pelvic region: Secondary | ICD-10-CM | POA: Diagnosis not present

## 2023-09-28 DIAGNOSIS — M62830 Muscle spasm of back: Secondary | ICD-10-CM | POA: Diagnosis not present

## 2023-09-29 DIAGNOSIS — M9905 Segmental and somatic dysfunction of pelvic region: Secondary | ICD-10-CM | POA: Diagnosis not present

## 2023-09-29 DIAGNOSIS — M9904 Segmental and somatic dysfunction of sacral region: Secondary | ICD-10-CM | POA: Diagnosis not present

## 2023-09-29 DIAGNOSIS — M9903 Segmental and somatic dysfunction of lumbar region: Secondary | ICD-10-CM | POA: Diagnosis not present

## 2023-09-29 DIAGNOSIS — M62830 Muscle spasm of back: Secondary | ICD-10-CM | POA: Diagnosis not present

## 2023-09-30 DIAGNOSIS — M9905 Segmental and somatic dysfunction of pelvic region: Secondary | ICD-10-CM | POA: Diagnosis not present

## 2023-09-30 DIAGNOSIS — M62830 Muscle spasm of back: Secondary | ICD-10-CM | POA: Diagnosis not present

## 2023-09-30 DIAGNOSIS — M9903 Segmental and somatic dysfunction of lumbar region: Secondary | ICD-10-CM | POA: Diagnosis not present

## 2023-09-30 DIAGNOSIS — M9904 Segmental and somatic dysfunction of sacral region: Secondary | ICD-10-CM | POA: Diagnosis not present

## 2023-10-05 DIAGNOSIS — M9904 Segmental and somatic dysfunction of sacral region: Secondary | ICD-10-CM | POA: Diagnosis not present

## 2023-10-05 DIAGNOSIS — M9905 Segmental and somatic dysfunction of pelvic region: Secondary | ICD-10-CM | POA: Diagnosis not present

## 2023-10-05 DIAGNOSIS — M9903 Segmental and somatic dysfunction of lumbar region: Secondary | ICD-10-CM | POA: Diagnosis not present

## 2023-10-05 DIAGNOSIS — M62830 Muscle spasm of back: Secondary | ICD-10-CM | POA: Diagnosis not present

## 2023-10-06 DIAGNOSIS — M9903 Segmental and somatic dysfunction of lumbar region: Secondary | ICD-10-CM | POA: Diagnosis not present

## 2023-10-06 DIAGNOSIS — M9904 Segmental and somatic dysfunction of sacral region: Secondary | ICD-10-CM | POA: Diagnosis not present

## 2023-10-06 DIAGNOSIS — M62830 Muscle spasm of back: Secondary | ICD-10-CM | POA: Diagnosis not present

## 2023-10-06 DIAGNOSIS — M9905 Segmental and somatic dysfunction of pelvic region: Secondary | ICD-10-CM | POA: Diagnosis not present

## 2023-10-07 DIAGNOSIS — M62830 Muscle spasm of back: Secondary | ICD-10-CM | POA: Diagnosis not present

## 2023-10-07 DIAGNOSIS — M9903 Segmental and somatic dysfunction of lumbar region: Secondary | ICD-10-CM | POA: Diagnosis not present

## 2023-10-07 DIAGNOSIS — M9904 Segmental and somatic dysfunction of sacral region: Secondary | ICD-10-CM | POA: Diagnosis not present

## 2023-10-07 DIAGNOSIS — M9905 Segmental and somatic dysfunction of pelvic region: Secondary | ICD-10-CM | POA: Diagnosis not present

## 2023-10-12 DIAGNOSIS — M9904 Segmental and somatic dysfunction of sacral region: Secondary | ICD-10-CM | POA: Diagnosis not present

## 2023-10-12 DIAGNOSIS — M9905 Segmental and somatic dysfunction of pelvic region: Secondary | ICD-10-CM | POA: Diagnosis not present

## 2023-10-12 DIAGNOSIS — M9903 Segmental and somatic dysfunction of lumbar region: Secondary | ICD-10-CM | POA: Diagnosis not present

## 2023-10-12 DIAGNOSIS — M62830 Muscle spasm of back: Secondary | ICD-10-CM | POA: Diagnosis not present

## 2023-10-13 DIAGNOSIS — M9903 Segmental and somatic dysfunction of lumbar region: Secondary | ICD-10-CM | POA: Diagnosis not present

## 2023-10-13 DIAGNOSIS — M9904 Segmental and somatic dysfunction of sacral region: Secondary | ICD-10-CM | POA: Diagnosis not present

## 2023-10-13 DIAGNOSIS — M9905 Segmental and somatic dysfunction of pelvic region: Secondary | ICD-10-CM | POA: Diagnosis not present

## 2023-10-13 DIAGNOSIS — M62830 Muscle spasm of back: Secondary | ICD-10-CM | POA: Diagnosis not present

## 2023-10-14 DIAGNOSIS — M9905 Segmental and somatic dysfunction of pelvic region: Secondary | ICD-10-CM | POA: Diagnosis not present

## 2023-10-14 DIAGNOSIS — M62830 Muscle spasm of back: Secondary | ICD-10-CM | POA: Diagnosis not present

## 2023-10-14 DIAGNOSIS — M9903 Segmental and somatic dysfunction of lumbar region: Secondary | ICD-10-CM | POA: Diagnosis not present

## 2023-10-14 DIAGNOSIS — M9904 Segmental and somatic dysfunction of sacral region: Secondary | ICD-10-CM | POA: Diagnosis not present

## 2023-10-19 DIAGNOSIS — M62830 Muscle spasm of back: Secondary | ICD-10-CM | POA: Diagnosis not present

## 2023-10-19 DIAGNOSIS — M9905 Segmental and somatic dysfunction of pelvic region: Secondary | ICD-10-CM | POA: Diagnosis not present

## 2023-10-19 DIAGNOSIS — M9903 Segmental and somatic dysfunction of lumbar region: Secondary | ICD-10-CM | POA: Diagnosis not present

## 2023-10-19 DIAGNOSIS — M9904 Segmental and somatic dysfunction of sacral region: Secondary | ICD-10-CM | POA: Diagnosis not present

## 2023-10-20 DIAGNOSIS — M9903 Segmental and somatic dysfunction of lumbar region: Secondary | ICD-10-CM | POA: Diagnosis not present

## 2023-10-20 DIAGNOSIS — M9905 Segmental and somatic dysfunction of pelvic region: Secondary | ICD-10-CM | POA: Diagnosis not present

## 2023-10-20 DIAGNOSIS — M9904 Segmental and somatic dysfunction of sacral region: Secondary | ICD-10-CM | POA: Diagnosis not present

## 2023-10-20 DIAGNOSIS — M62830 Muscle spasm of back: Secondary | ICD-10-CM | POA: Diagnosis not present

## 2023-10-21 DIAGNOSIS — M9904 Segmental and somatic dysfunction of sacral region: Secondary | ICD-10-CM | POA: Diagnosis not present

## 2023-10-21 DIAGNOSIS — M62830 Muscle spasm of back: Secondary | ICD-10-CM | POA: Diagnosis not present

## 2023-10-21 DIAGNOSIS — M9905 Segmental and somatic dysfunction of pelvic region: Secondary | ICD-10-CM | POA: Diagnosis not present

## 2023-10-21 DIAGNOSIS — M9903 Segmental and somatic dysfunction of lumbar region: Secondary | ICD-10-CM | POA: Diagnosis not present

## 2023-10-26 DIAGNOSIS — M9904 Segmental and somatic dysfunction of sacral region: Secondary | ICD-10-CM | POA: Diagnosis not present

## 2023-10-26 DIAGNOSIS — M62830 Muscle spasm of back: Secondary | ICD-10-CM | POA: Diagnosis not present

## 2023-10-26 DIAGNOSIS — M9905 Segmental and somatic dysfunction of pelvic region: Secondary | ICD-10-CM | POA: Diagnosis not present

## 2023-10-26 DIAGNOSIS — M9903 Segmental and somatic dysfunction of lumbar region: Secondary | ICD-10-CM | POA: Diagnosis not present

## 2023-10-28 DIAGNOSIS — M9904 Segmental and somatic dysfunction of sacral region: Secondary | ICD-10-CM | POA: Diagnosis not present

## 2023-10-28 DIAGNOSIS — M9903 Segmental and somatic dysfunction of lumbar region: Secondary | ICD-10-CM | POA: Diagnosis not present

## 2023-10-28 DIAGNOSIS — M9905 Segmental and somatic dysfunction of pelvic region: Secondary | ICD-10-CM | POA: Diagnosis not present

## 2023-10-28 DIAGNOSIS — M62830 Muscle spasm of back: Secondary | ICD-10-CM | POA: Diagnosis not present

## 2023-11-02 DIAGNOSIS — M9905 Segmental and somatic dysfunction of pelvic region: Secondary | ICD-10-CM | POA: Diagnosis not present

## 2023-11-02 DIAGNOSIS — M9903 Segmental and somatic dysfunction of lumbar region: Secondary | ICD-10-CM | POA: Diagnosis not present

## 2023-11-02 DIAGNOSIS — M62830 Muscle spasm of back: Secondary | ICD-10-CM | POA: Diagnosis not present

## 2023-11-02 DIAGNOSIS — M9904 Segmental and somatic dysfunction of sacral region: Secondary | ICD-10-CM | POA: Diagnosis not present

## 2023-11-04 DIAGNOSIS — M62830 Muscle spasm of back: Secondary | ICD-10-CM | POA: Diagnosis not present

## 2023-11-04 DIAGNOSIS — M9903 Segmental and somatic dysfunction of lumbar region: Secondary | ICD-10-CM | POA: Diagnosis not present

## 2023-11-04 DIAGNOSIS — M9904 Segmental and somatic dysfunction of sacral region: Secondary | ICD-10-CM | POA: Diagnosis not present

## 2023-11-04 DIAGNOSIS — M9905 Segmental and somatic dysfunction of pelvic region: Secondary | ICD-10-CM | POA: Diagnosis not present

## 2023-11-09 DIAGNOSIS — M62830 Muscle spasm of back: Secondary | ICD-10-CM | POA: Diagnosis not present

## 2023-11-09 DIAGNOSIS — M9904 Segmental and somatic dysfunction of sacral region: Secondary | ICD-10-CM | POA: Diagnosis not present

## 2023-11-09 DIAGNOSIS — M9903 Segmental and somatic dysfunction of lumbar region: Secondary | ICD-10-CM | POA: Diagnosis not present

## 2023-11-09 DIAGNOSIS — M9905 Segmental and somatic dysfunction of pelvic region: Secondary | ICD-10-CM | POA: Diagnosis not present

## 2023-11-11 DIAGNOSIS — M62830 Muscle spasm of back: Secondary | ICD-10-CM | POA: Diagnosis not present

## 2023-11-11 DIAGNOSIS — M9904 Segmental and somatic dysfunction of sacral region: Secondary | ICD-10-CM | POA: Diagnosis not present

## 2023-11-11 DIAGNOSIS — M9903 Segmental and somatic dysfunction of lumbar region: Secondary | ICD-10-CM | POA: Diagnosis not present

## 2023-11-11 DIAGNOSIS — M9905 Segmental and somatic dysfunction of pelvic region: Secondary | ICD-10-CM | POA: Diagnosis not present

## 2023-11-16 DIAGNOSIS — M9903 Segmental and somatic dysfunction of lumbar region: Secondary | ICD-10-CM | POA: Diagnosis not present

## 2023-11-16 DIAGNOSIS — M9904 Segmental and somatic dysfunction of sacral region: Secondary | ICD-10-CM | POA: Diagnosis not present

## 2023-11-16 DIAGNOSIS — M9905 Segmental and somatic dysfunction of pelvic region: Secondary | ICD-10-CM | POA: Diagnosis not present

## 2023-11-16 DIAGNOSIS — M62830 Muscle spasm of back: Secondary | ICD-10-CM | POA: Diagnosis not present

## 2023-11-23 DIAGNOSIS — M9904 Segmental and somatic dysfunction of sacral region: Secondary | ICD-10-CM | POA: Diagnosis not present

## 2023-11-23 DIAGNOSIS — M9905 Segmental and somatic dysfunction of pelvic region: Secondary | ICD-10-CM | POA: Diagnosis not present

## 2023-11-23 DIAGNOSIS — M9903 Segmental and somatic dysfunction of lumbar region: Secondary | ICD-10-CM | POA: Diagnosis not present

## 2023-11-23 DIAGNOSIS — M62830 Muscle spasm of back: Secondary | ICD-10-CM | POA: Diagnosis not present

## 2023-11-30 DIAGNOSIS — M62830 Muscle spasm of back: Secondary | ICD-10-CM | POA: Diagnosis not present

## 2023-11-30 DIAGNOSIS — M9903 Segmental and somatic dysfunction of lumbar region: Secondary | ICD-10-CM | POA: Diagnosis not present

## 2023-11-30 DIAGNOSIS — M9905 Segmental and somatic dysfunction of pelvic region: Secondary | ICD-10-CM | POA: Diagnosis not present

## 2023-11-30 DIAGNOSIS — M9904 Segmental and somatic dysfunction of sacral region: Secondary | ICD-10-CM | POA: Diagnosis not present

## 2023-12-07 DIAGNOSIS — M9904 Segmental and somatic dysfunction of sacral region: Secondary | ICD-10-CM | POA: Diagnosis not present

## 2023-12-07 DIAGNOSIS — M9905 Segmental and somatic dysfunction of pelvic region: Secondary | ICD-10-CM | POA: Diagnosis not present

## 2023-12-07 DIAGNOSIS — M62830 Muscle spasm of back: Secondary | ICD-10-CM | POA: Diagnosis not present

## 2023-12-07 DIAGNOSIS — M9903 Segmental and somatic dysfunction of lumbar region: Secondary | ICD-10-CM | POA: Diagnosis not present

## 2023-12-14 DIAGNOSIS — M9903 Segmental and somatic dysfunction of lumbar region: Secondary | ICD-10-CM | POA: Diagnosis not present

## 2023-12-14 DIAGNOSIS — M62830 Muscle spasm of back: Secondary | ICD-10-CM | POA: Diagnosis not present

## 2023-12-14 DIAGNOSIS — M9904 Segmental and somatic dysfunction of sacral region: Secondary | ICD-10-CM | POA: Diagnosis not present

## 2023-12-14 DIAGNOSIS — M9905 Segmental and somatic dysfunction of pelvic region: Secondary | ICD-10-CM | POA: Diagnosis not present

## 2023-12-21 DIAGNOSIS — M9905 Segmental and somatic dysfunction of pelvic region: Secondary | ICD-10-CM | POA: Diagnosis not present

## 2023-12-21 DIAGNOSIS — M62830 Muscle spasm of back: Secondary | ICD-10-CM | POA: Diagnosis not present

## 2023-12-21 DIAGNOSIS — M9904 Segmental and somatic dysfunction of sacral region: Secondary | ICD-10-CM | POA: Diagnosis not present

## 2023-12-21 DIAGNOSIS — M9903 Segmental and somatic dysfunction of lumbar region: Secondary | ICD-10-CM | POA: Diagnosis not present

## 2023-12-28 DIAGNOSIS — M9905 Segmental and somatic dysfunction of pelvic region: Secondary | ICD-10-CM | POA: Diagnosis not present

## 2023-12-28 DIAGNOSIS — M62830 Muscle spasm of back: Secondary | ICD-10-CM | POA: Diagnosis not present

## 2023-12-28 DIAGNOSIS — M9903 Segmental and somatic dysfunction of lumbar region: Secondary | ICD-10-CM | POA: Diagnosis not present

## 2023-12-28 DIAGNOSIS — M9904 Segmental and somatic dysfunction of sacral region: Secondary | ICD-10-CM | POA: Diagnosis not present

## 2024-01-04 DIAGNOSIS — M62830 Muscle spasm of back: Secondary | ICD-10-CM | POA: Diagnosis not present

## 2024-01-04 DIAGNOSIS — M9904 Segmental and somatic dysfunction of sacral region: Secondary | ICD-10-CM | POA: Diagnosis not present

## 2024-01-04 DIAGNOSIS — M9903 Segmental and somatic dysfunction of lumbar region: Secondary | ICD-10-CM | POA: Diagnosis not present

## 2024-01-04 DIAGNOSIS — M9905 Segmental and somatic dysfunction of pelvic region: Secondary | ICD-10-CM | POA: Diagnosis not present

## 2024-03-20 DIAGNOSIS — M9904 Segmental and somatic dysfunction of sacral region: Secondary | ICD-10-CM | POA: Diagnosis not present

## 2024-03-20 DIAGNOSIS — M62830 Muscle spasm of back: Secondary | ICD-10-CM | POA: Diagnosis not present

## 2024-03-20 DIAGNOSIS — M9905 Segmental and somatic dysfunction of pelvic region: Secondary | ICD-10-CM | POA: Diagnosis not present

## 2024-03-20 DIAGNOSIS — M9903 Segmental and somatic dysfunction of lumbar region: Secondary | ICD-10-CM | POA: Diagnosis not present

## 2024-03-28 DIAGNOSIS — M9905 Segmental and somatic dysfunction of pelvic region: Secondary | ICD-10-CM | POA: Diagnosis not present

## 2024-03-28 DIAGNOSIS — M9904 Segmental and somatic dysfunction of sacral region: Secondary | ICD-10-CM | POA: Diagnosis not present

## 2024-03-28 DIAGNOSIS — M62830 Muscle spasm of back: Secondary | ICD-10-CM | POA: Diagnosis not present

## 2024-03-28 DIAGNOSIS — M9903 Segmental and somatic dysfunction of lumbar region: Secondary | ICD-10-CM | POA: Diagnosis not present

## 2024-04-03 DIAGNOSIS — M62830 Muscle spasm of back: Secondary | ICD-10-CM | POA: Diagnosis not present

## 2024-04-03 DIAGNOSIS — M9904 Segmental and somatic dysfunction of sacral region: Secondary | ICD-10-CM | POA: Diagnosis not present

## 2024-04-03 DIAGNOSIS — M9905 Segmental and somatic dysfunction of pelvic region: Secondary | ICD-10-CM | POA: Diagnosis not present

## 2024-04-03 DIAGNOSIS — M9903 Segmental and somatic dysfunction of lumbar region: Secondary | ICD-10-CM | POA: Diagnosis not present

## 2024-04-10 DIAGNOSIS — M9904 Segmental and somatic dysfunction of sacral region: Secondary | ICD-10-CM | POA: Diagnosis not present

## 2024-04-10 DIAGNOSIS — M9903 Segmental and somatic dysfunction of lumbar region: Secondary | ICD-10-CM | POA: Diagnosis not present

## 2024-04-10 DIAGNOSIS — M62830 Muscle spasm of back: Secondary | ICD-10-CM | POA: Diagnosis not present

## 2024-04-10 DIAGNOSIS — M9905 Segmental and somatic dysfunction of pelvic region: Secondary | ICD-10-CM | POA: Diagnosis not present

## 2024-04-17 DIAGNOSIS — M62830 Muscle spasm of back: Secondary | ICD-10-CM | POA: Diagnosis not present

## 2024-04-17 DIAGNOSIS — M9904 Segmental and somatic dysfunction of sacral region: Secondary | ICD-10-CM | POA: Diagnosis not present

## 2024-04-17 DIAGNOSIS — M9905 Segmental and somatic dysfunction of pelvic region: Secondary | ICD-10-CM | POA: Diagnosis not present

## 2024-04-17 DIAGNOSIS — M9903 Segmental and somatic dysfunction of lumbar region: Secondary | ICD-10-CM | POA: Diagnosis not present

## 2024-05-18 DIAGNOSIS — G8929 Other chronic pain: Secondary | ICD-10-CM | POA: Diagnosis not present

## 2024-05-18 DIAGNOSIS — M5416 Radiculopathy, lumbar region: Secondary | ICD-10-CM | POA: Diagnosis not present

## 2024-05-18 DIAGNOSIS — M25552 Pain in left hip: Secondary | ICD-10-CM | POA: Diagnosis not present
